# Patient Record
Sex: Female | Born: 1995 | Race: White | Hispanic: No | Smoking: Never smoker
Health system: Southern US, Community
[De-identification: ages and names within clinical notes are randomized; demographics above are authoritative.]

## PROBLEM LIST (undated history)

## (undated) HISTORY — PX: EXTERNAL EAR SURGERY: SHX627

---

## 2014-02-06 ENCOUNTER — Ambulatory Visit: Payer: Self-pay | Admitting: Physician Assistant

## 2016-11-16 ENCOUNTER — Ambulatory Visit (INDEPENDENT_AMBULATORY_CARE_PROVIDER_SITE_OTHER): Payer: BLUE CROSS/BLUE SHIELD

## 2016-11-16 ENCOUNTER — Ambulatory Visit
Admission: EM | Admit: 2016-11-16 | Discharge: 2016-11-16 | Disposition: A | Discharge status: Home / Self Care | Payer: BLUE CROSS/BLUE SHIELD | Attending: Family Medicine | Admitting: Family Medicine

## 2016-11-16 DIAGNOSIS — M25572 Pain in left ankle and joints of left foot: Secondary | ICD-10-CM

## 2016-11-16 MED ORDER — NAPROXEN 375 MG PO TABS: 375.0000 mg | ORAL_TABLET | Freq: Two times a day (BID) | ORAL | 0 refills | Status: AC

## 2016-11-16 NOTE — ED Provider Notes (Signed)
CSN: [id number]     Arrival date & time 11/16/16  1707 History   None    Chief Complaint  Patient presents with  . Ankle Pain    letf   (Consider location/radiation/quality/duration/timing/severity/associated sxs/prior Treatment) The history is provided by the patient. No language interpreter was used.  Ankle Pain  Location:  Ankle Time since incident:  1 month Ankle location:  L ankle Pain details:    Quality:  Aching   Radiates to:  Does not radiate   Severity:  Moderate   Onset quality:  Unable to specify   Timing:  Intermittent   Progression:  Waxing and waning Chronicity:  Recurrent Dislocation: no   Foreign body present:  No foreign bodies Tetanus status:  Up to date Associated symptoms: no fever     History reviewed. No pertinent past medical history. Past Surgical History:  Procedure Laterality Date  . EXTERNAL EAR SURGERY     History reviewed. No pertinent family history. Social History  Substance Use Topics  . Smoking status: Never Smoker  . Smokeless tobacco: Never Used  . Alcohol use Yes   OB History    No data available     Review of Systems  Constitutional: Negative for chills and fever.  HENT: Negative for ear pain and sore throat.   Eyes: Negative for pain and visual disturbance.  Respiratory: Negative for cough and shortness of breath.   Cardiovascular: Negative for chest pain and palpitations.  Gastrointestinal: Negative for abdominal pain and vomiting.  Genitourinary: Negative for dysuria and hematuria.  Musculoskeletal: Positive for gait problem. Negative for arthralgias.  Skin: Negative for color change and rash.  Neurological: Negative for seizures and syncope.  All other systems reviewed and are negative.   Allergies  Patient has no known allergies.  Home Medications   Prior to Admission medications   Medication Sig Start Date End Date Taking? Authorizing Provider  naproxen (NAPROSYN) 375 MG tablet Take 1 tablet (375 mg total) by  mouth 2 (two) times daily. 11/16/16   Prezley Qadir, Para March, NP   Meds Ordered and Administered this Visit  Medications - No data to display  BP (!) 104/50 (BP Location: Left Arm)   Pulse 71   Temp 97.9 F (36.6 C) (Oral)   Resp 18   Ht 5\' 4"  (1.626 m)   LMP 10/28/2016   SpO2 100%  No data found.   Physical Exam  Constitutional: She is oriented to person, place, and time. She appears well-developed and well-nourished. She is active and cooperative. No distress.  HENT:  Head: Normocephalic and atraumatic.  Eyes: Conjunctivae are normal.  Neck: Neck supple.  Cardiovascular: Normal rate, regular rhythm and normal pulses.   No murmur heard. Pulses:      Dorsalis pedis pulses are 2+ on the right side, and 2+ on the left side.  Pulmonary/Chest: Effort normal and breath sounds normal. No respiratory distress.  Abdominal: Soft. There is no tenderness.  Musculoskeletal: Normal range of motion. She exhibits tenderness. She exhibits no edema.       Left ankle: She exhibits normal range of motion, no swelling, no ecchymosis, no deformity, no laceration and normal pulse. Tenderness. Lateral malleolus and medial malleolus tenderness found. Achilles tendon normal.  Neurological: She is alert and oriented to person, place, and time. She has normal strength. No sensory deficit. Gait abnormal. GCS eye subscore is 4. GCS verbal subscore is 5. GCS motor subscore is 6.  Skin: Skin is warm, dry and intact.  Psychiatric:  She has a normal mood and affect. Her speech is normal and behavior is normal.  Nursing note and vitals reviewed.   Urgent Care Course     Procedures (including critical care time)  Labs Review Labs Reviewed - No data to display  Imaging Review Dg Ankle Complete Left  Result Date: 11/16/2016 CLINICAL DATA:  Acute left ankle pain for 1 month. No known injury. Initial encounter. EXAM: LEFT ANKLE COMPLETE - 3+ VIEW COMPARISON:  None. FINDINGS: There is no evidence of fracture,  dislocation, or joint effusion. There is no evidence of arthropathy or other focal bone abnormality. Soft tissues are unremarkable. IMPRESSION: Negative. Electronically Signed   By: Harmon PierJeffrey  Hu M.D.   On: 11/16/2016 18:28         MDM   1. Acute left ankle pain     1858: Your xray was normal. Please take naproxen as directed. Wear supportive shoes and orthotic inserts. Follow up with podiatry if pain persists-call for appt. Return to UC as needed. Pt verbalized understanding to this provider.    Clancy Gourdefelice, Harini Dearmond, NP 11/16/16 [phone]

## 2016-11-16 NOTE — Discharge Instructions (Signed)
Your xray was normal. Please take naproxen as directed. Wear supportive shoes and orthotic inserts. Follow up with podiatry if pain persists-call for appt. Return to UC as needed.

## 2016-11-16 NOTE — ED Triage Notes (Signed)
Patient complains of left ankle pain. [facility] Patient states that she was unable to walk on ankle for 4 days. Patient now reports that she has pain with every 6-7 steps that she takes. Patient states that she has no known injury.

## 2018-05-25 IMAGING — CR DG ANKLE COMPLETE 3+V*L*
3 series · 3 of 3 positions shown · non-contrast
Comparison: None.

CLINICAL DATA: Acute left ankle pain for 1 month. No known injury.
Initial encounter.

EXAM:
LEFT ANKLE COMPLETE - 3+ VIEW

[ankle ap]
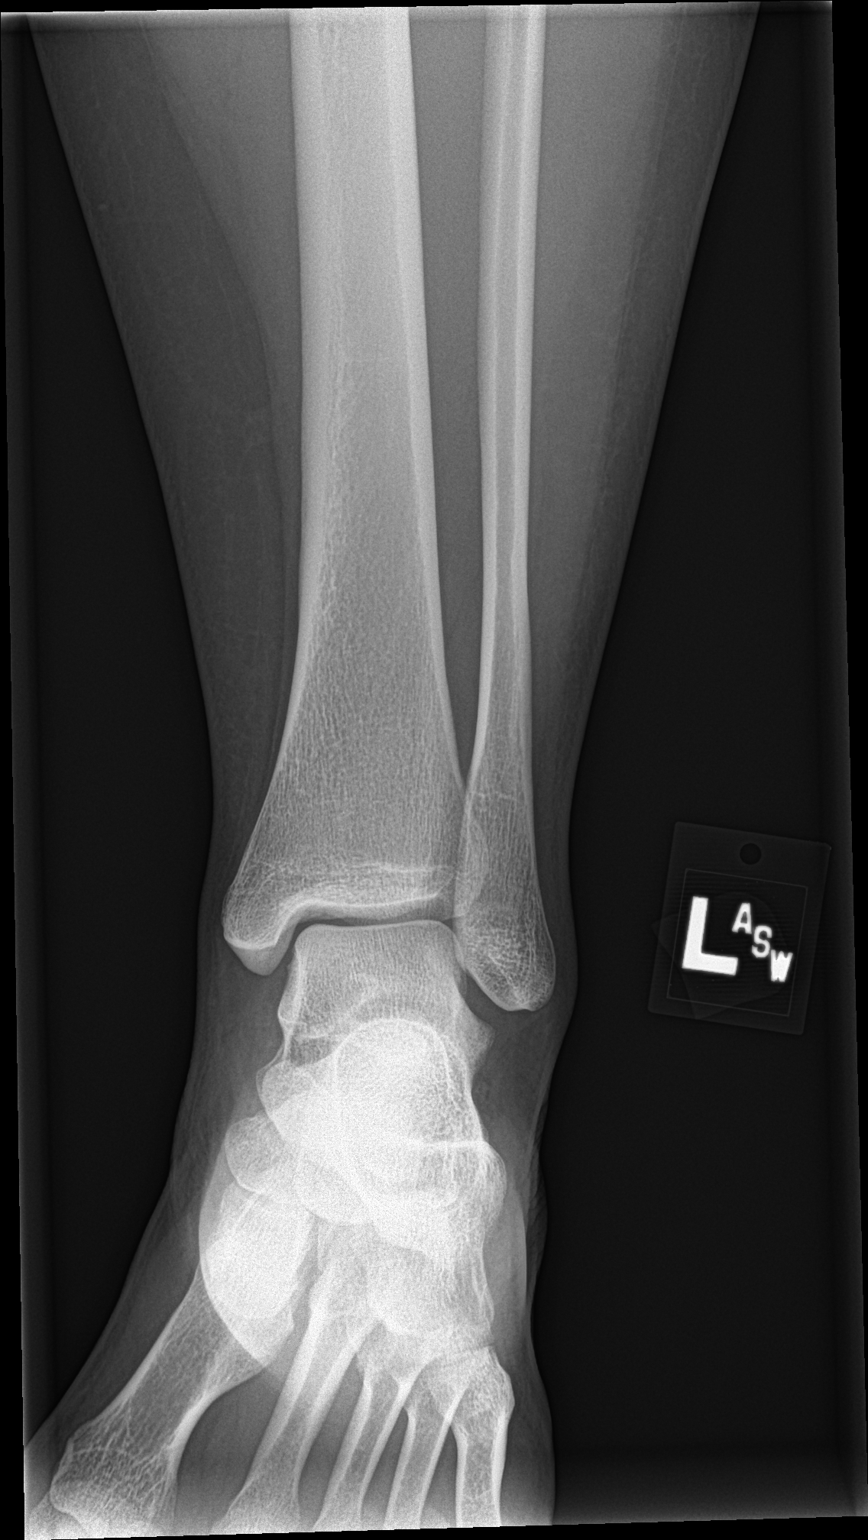

[ankle obl]
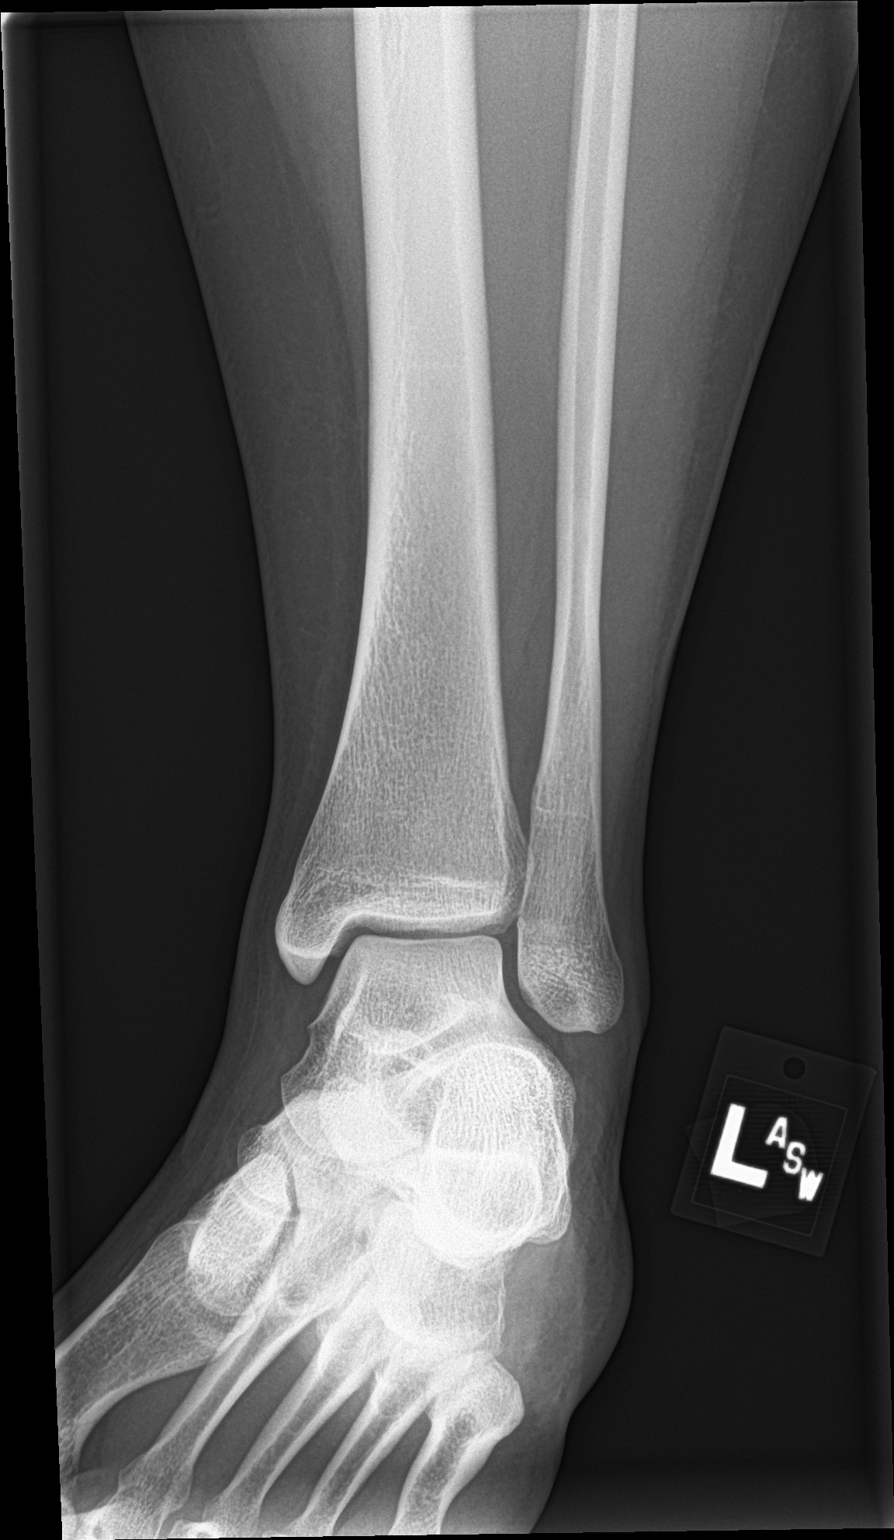

[ankle lat]
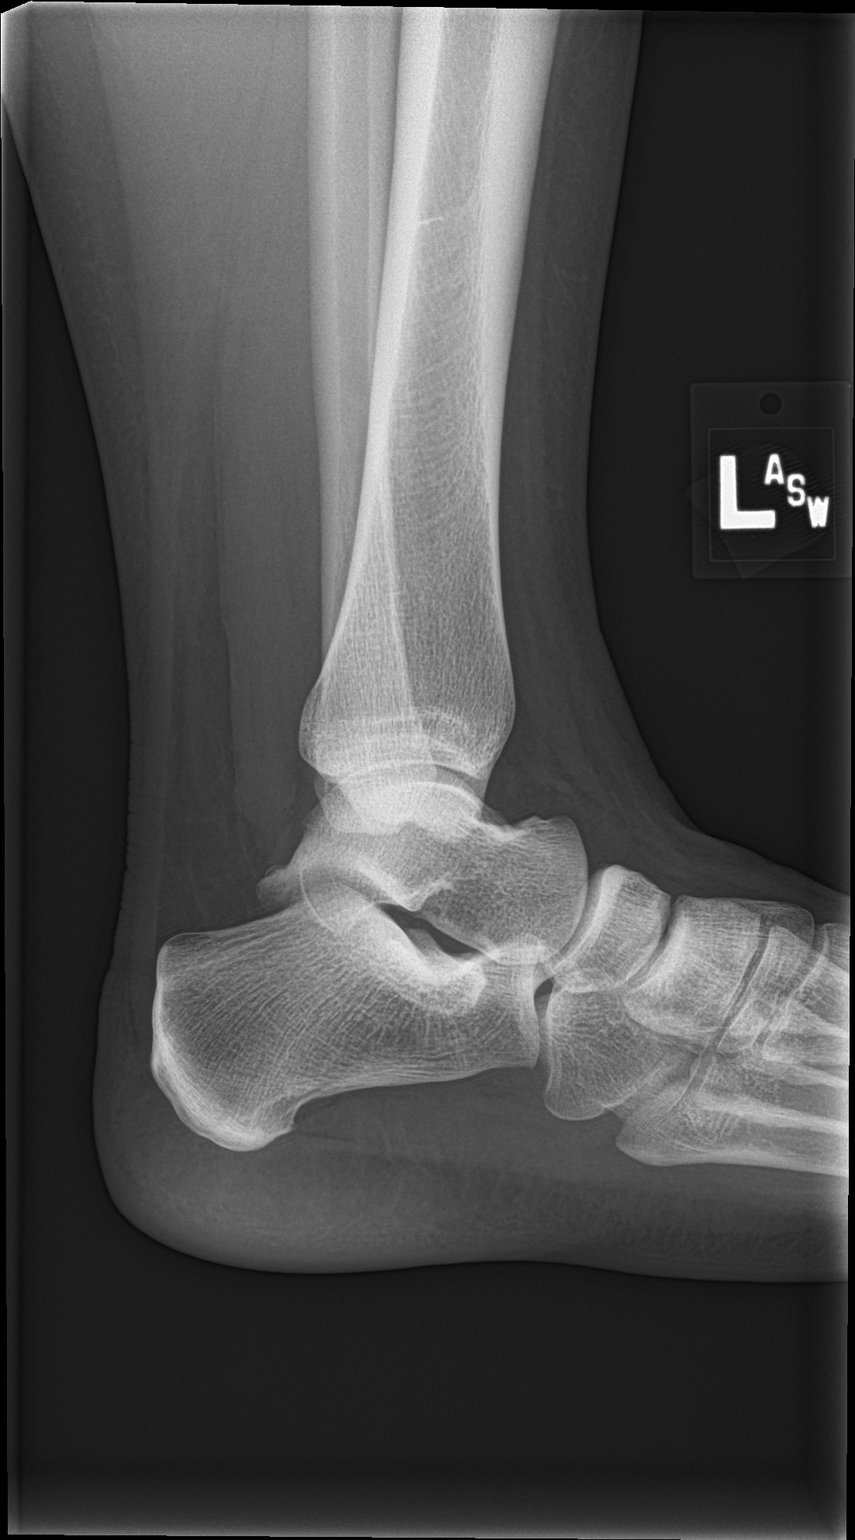

[3 of 3 positions shown; findings below may reference images not displayed]

FINDINGS: There is no evidence of fracture, dislocation, or joint effusion.
There is no evidence of arthropathy or other focal bone abnormality.
Soft tissues are unremarkable.
IMPRESSION: Negative.
